# Patient Record
Sex: Female | Born: 2000 | Hispanic: No | Marital: Single | Smoking: Never smoker
Health system: Southern US, Community
[De-identification: ages and names within clinical notes are randomized; demographics above are authoritative.]

## PROBLEM LIST (undated history)

## (undated) DIAGNOSIS — T7840XA Allergy, unspecified, initial encounter: Secondary | ICD-10-CM

## (undated) DIAGNOSIS — F329 Major depressive disorder, single episode, unspecified: Secondary | ICD-10-CM

## (undated) DIAGNOSIS — F32A Depression, unspecified: Secondary | ICD-10-CM

## (undated) DIAGNOSIS — F419 Anxiety disorder, unspecified: Secondary | ICD-10-CM

## (undated) DIAGNOSIS — J45909 Unspecified asthma, uncomplicated: Secondary | ICD-10-CM

## (undated) HISTORY — PX: TONSILLECTOMY: SUR1361

---
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
## 2019-10-31 ENCOUNTER — Emergency Department (HOSPITAL_COMMUNITY)
Admission: EM | Admit: 2019-10-31 | Discharge: 2019-10-31 | Disposition: A | Discharge status: Home / Self Care | Payer: Medicaid Other | Attending: Emergency Medicine | Admitting: Emergency Medicine

## 2019-10-31 ENCOUNTER — Encounter (HOSPITAL_COMMUNITY): Payer: Self-pay | Admitting: Emergency Medicine

## 2019-10-31 ENCOUNTER — Other Ambulatory Visit: Payer: Self-pay

## 2019-10-31 DIAGNOSIS — J45901 Unspecified asthma with (acute) exacerbation: Secondary | ICD-10-CM | POA: Insufficient documentation

## 2019-10-31 HISTORY — DX: Allergy, unspecified, initial encounter: T78.40XA

## 2019-10-31 HISTORY — DX: Unspecified asthma, uncomplicated: J45.909

## 2019-10-31 LAB — I-STAT BETA HCG BLOOD, ED (MC, WL, AP ONLY): I-stat hCG, quantitative: <5 m[IU]/mL (ref <5)

## 2019-10-31 MED ORDER — PREDNISONE 20 MG PO TABS
60.0000 mg | ORAL_TABLET | Freq: Once | ORAL | Status: AC
  Administered 2019-10-31: 60 mg via ORAL
  Filled 2019-10-31: qty 3

## 2019-10-31 MED ORDER — ALBUTEROL SULFATE HFA 108 (90 BASE) MCG/ACT IN AERS
2.0000 | INHALATION_SPRAY | Freq: Once | RESPIRATORY_TRACT | Status: AC
  Administered 2019-10-31: 2 via RESPIRATORY_TRACT
  Filled 2019-10-31: qty 6.7

## 2019-10-31 MED ORDER — IPRATROPIUM BROMIDE HFA 17 MCG/ACT IN AERS
2.0000 | INHALATION_SPRAY | Freq: Once | RESPIRATORY_TRACT | Status: AC
  Administered 2019-10-31: 2 via RESPIRATORY_TRACT
  Filled 2019-10-31: qty 12.9

## 2019-10-31 MED ORDER — ALBUTEROL SULFATE HFA 108 (90 BASE) MCG/ACT IN AERS
8.0000 | INHALATION_SPRAY | Freq: Once | RESPIRATORY_TRACT | Status: AC
  Administered 2019-10-31: 8 via RESPIRATORY_TRACT
  Filled 2019-10-31: qty 6.7

## 2019-10-31 MED ORDER — PREDNISONE 20 MG PO TABS: 60.0000 mg | ORAL_TABLET | Freq: Every day | ORAL | 0 refills | Status: AC

## 2019-10-31 NOTE — ED Triage Notes (Signed)
Pt reports asthma exacerbation since walking to work last night.  Audible wheezing on arrival to triage.  States she is out of inhalers.

## 2019-10-31 NOTE — ED Provider Notes (Signed)
[facility] EMERGENCY DEPARTMENT Provider Note   CSN: [id number] Arrival date & time: 10/31/19  9417     History Chief Complaint  Patient presents with  . Asthma    Karen Ward is a 19 y.o. female.  The history is provided by the patient.  Shortness of Breath Severity:  Moderate Onset quality:  Gradual Timing:  Constant Progression:  Worsening Chronicity:  Recurrent Context: weather changes (asthma symtpoms while walking in the cold to work this morning)   Relieved by:  Nothing Worsened by:  Nothing Associated symptoms: cough and wheezing   Associated symptoms: no abdominal pain, no chest pain, no ear pain, no fever, no rash, no sore throat and no vomiting        Past Medical History:  Diagnosis Date  . Allergies   . Anxiety   . Asthma   . Depression     There are no problems to display for this patient.   Past Surgical History:  Procedure Laterality Date  . TONSILLECTOMY       OB History   No obstetric history on file.     Family History  Family history unknown: Yes    Social History   Tobacco Use  . Smoking status: Never Smoker  . Smokeless tobacco: Never Used  Substance Use Topics  . Alcohol use: Yes    Comment: occ  . Drug use: Yes    Types: Marijuana    Comment: occ    Home Medications Prior to Admission medications   Medication Sig Start Date End Date Taking? Authorizing Provider  cyclobenzaprine (FLEXERIL) 5 MG tablet Take 1 tablet (5 mg total) by mouth 2 (two) times daily as needed for muscle spasms. 12/22/17   Janne Napoleon, NP  naproxen (NAPROSYN) 375 MG tablet Take 1 tablet (375 mg total) by mouth 2 (two) times daily. 12/22/17   Janne Napoleon, NP  predniSONE (DELTASONE) 20 MG tablet Take 3 tablets (60 mg total) by mouth daily for 4 days. 10/31/19 11/04/19  Klee Kolek, DO    Allergies    Shellfish allergy, Fish allergy, and Other  Review of Systems   Review of Systems  Constitutional: Negative for  chills and fever.  HENT: Negative for ear pain and sore throat.   Eyes: Negative for pain and visual disturbance.  Respiratory: Positive for cough, shortness of breath and wheezing.   Cardiovascular: Negative for chest pain and palpitations.  Gastrointestinal: Negative for abdominal pain and vomiting.  Genitourinary: Negative for dysuria and hematuria.  Musculoskeletal: Negative for arthralgias and back pain.  Skin: Negative for color change and rash.  Neurological: Negative for seizures and syncope.  All other systems reviewed and are negative.   Physical Exam Updated Vital Signs BP 129/72   Pulse (!) 125   Temp 98.7 F (37.1 C) (Oral)   Resp 20   LMP 10/10/2019   SpO2 100%   Physical Exam Vitals and nursing note reviewed.  Constitutional:      General: She is not in acute distress.    Appearance: She is well-developed.  HENT:     Head: Normocephalic and atraumatic.     Nose: Nose normal.     Mouth/Throat:     Mouth: Mucous membranes are moist.     Pharynx: No oropharyngeal exudate or posterior oropharyngeal erythema.  Eyes:     Extraocular Movements: Extraocular movements intact.     Conjunctiva/sclera: Conjunctivae normal.     Pupils: Pupils are equal, round, and reactive  to light.  Cardiovascular:     Rate and Rhythm: Regular rhythm. Tachycardia present.     Pulses: Normal pulses.     Heart sounds: No murmur heard.   Pulmonary:     Effort: Pulmonary effort is normal. No respiratory distress.     Breath sounds: Wheezing present.  Abdominal:     Palpations: Abdomen is soft.     Tenderness: There is no abdominal tenderness.  Musculoskeletal:     Cervical back: Neck supple.  Skin:    General: Skin is warm and dry.     Capillary Refill: Capillary refill takes less than 2 seconds.  Neurological:     General: No focal deficit present.     Mental Status: She is alert.     ED Results / Procedures / Treatments   Labs (all labs ordered are listed, but only  abnormal results are displayed) Labs Reviewed  I-STAT BETA HCG BLOOD, ED ([facility], [facility], [facility] ONLY)    EKG EKG Interpretation  Date/Time:  Sunday October 31 2019 08:55:04 EDT Ventricular Rate:  111 PR Interval:  156 QRS Duration: 72 QT Interval:  312 QTC Calculation: 424 R Axis:   78 Text Interpretation: Sinus tachycardia Otherwise normal ECG Confirmed by Kaylan Friedmann, Siriah Treat ([id number]) on 10/31/2019 10:28:34 AM   Radiology No results found.  Procedures Procedures (including critical care time)  Medications Ordered in ED Medications  albuterol (VENTOLIN HFA) 108 (90 Base) MCG/ACT inhaler 8 puff (8 puffs Inhalation Given 10/31/19 0908)  ipratropium (ATROVENT HFA) inhaler 2 puff (2 puffs Inhalation Given 10/31/19 0908)  predniSONE (DELTASONE) tablet 60 mg (60 mg Oral Given 10/31/19 1005)    ED Course  I have reviewed the triage vital signs and the nursing notes.  Pertinent labs & imaging results that were available during my care of the patient were reviewed by me and considered in my medical decision making (see chart for details).    MDM Rules/Calculators/A&P                          Karen Ward is a 19 year old female with history of asthma who presents the ED with shortness of breath, wheezing.  Patient with overall unremarkable vitals.  Wheezing throughout on exam.  Appears that she had bronchospasm.  She states that she has some symptoms overnight but was worse while walking in the cold to work today.  Did not have her inhaler use.  Patient given albuterol and Atrovent with great improvement.  Given first dose of steroids.  On repeat exam patient with improved breath sounds minimal wheezing.  Overall suspect asthma exacerbation.  Recommend continued use of albuterol and prednisone.  No hypoxia.  No respiratory distress.  Discharged in good condition.  Understands return precautions.  This chart was dictated using voice recognition software.  Despite best efforts to proofread,  errors  can occur which can change the documentation meaning.    Final Clinical Impression(s) / ED Diagnoses Final diagnoses:  Mild asthma with exacerbation, unspecified whether persistent    Rx / DC Orders ED Discharge Orders         Ordered    predniSONE (DELTASONE) 20 MG tablet  Daily        10 /17/21 1102           Keifer Habib, DO 10/31/19 1102

## 2019-10-31 NOTE — ED Notes (Signed)
Pt stated that she was always nervous and anxious in a hospital setting, but stated otherwise she felt fine

## 2020-03-19 ENCOUNTER — Other Ambulatory Visit: Payer: Self-pay

## 2020-03-19 ENCOUNTER — Emergency Department (HOSPITAL_COMMUNITY)
Admission: EM | Admit: 2020-03-19 | Discharge: 2020-03-19 | Disposition: A | Discharge status: Home / Self Care | Payer: Self-pay | Attending: Emergency Medicine | Admitting: Emergency Medicine

## 2020-03-19 ENCOUNTER — Emergency Department (HOSPITAL_COMMUNITY): Payer: Self-pay

## 2020-03-19 ENCOUNTER — Encounter (HOSPITAL_COMMUNITY): Payer: Self-pay

## 2020-03-19 DIAGNOSIS — R10817 Generalized abdominal tenderness: Secondary | ICD-10-CM | POA: Insufficient documentation

## 2020-03-19 DIAGNOSIS — R110 Nausea: Secondary | ICD-10-CM | POA: Diagnosis not present

## 2020-03-19 DIAGNOSIS — R42 Dizziness and giddiness: Secondary | ICD-10-CM | POA: Diagnosis not present

## 2020-03-19 DIAGNOSIS — Y92410 Unspecified street and highway as the place of occurrence of the external cause: Secondary | ICD-10-CM | POA: Insufficient documentation

## 2020-03-19 DIAGNOSIS — N939 Abnormal uterine and vaginal bleeding, unspecified: Secondary | ICD-10-CM | POA: Insufficient documentation

## 2020-03-19 DIAGNOSIS — R519 Headache, unspecified: Secondary | ICD-10-CM | POA: Insufficient documentation

## 2020-03-19 DIAGNOSIS — H53149 Visual discomfort, unspecified: Secondary | ICD-10-CM | POA: Diagnosis not present

## 2020-03-19 DIAGNOSIS — J45909 Unspecified asthma, uncomplicated: Secondary | ICD-10-CM | POA: Insufficient documentation

## 2020-03-19 LAB — COMPREHENSIVE METABOLIC PANEL
ALT: 15 U/L (ref 0–44)
AST: 18 U/L (ref 15–41)
Albumin: 4.3 g/dL (ref 3.5–5.0)
Alkaline Phosphatase: 34 U/L — ABNORMAL LOW (ref 38–126)
Anion gap: 10 (ref 5–15)
BUN: 12 mg/dL (ref 6–20)
CO2: 22 mmol/L (ref 22–32)
Calcium: 8.9 mg/dL (ref 8.9–10.3)
Chloride: 107 mmol/L (ref 98–111)
Creatinine, Ser: 0.83 mg/dL (ref 0.44–1.00)
GFR, Estimated: >60 mL/min (ref >60)
Glucose, Bld: 95 mg/dL (ref 70–99)
Potassium: 3.9 mmol/L (ref 3.5–5.1)
Sodium: 139 mmol/L (ref 135–145)
Total Bilirubin: 0.8 mg/dL (ref 0.3–1.2)
Total Protein: 7.6 g/dL (ref 6.5–8.1)

## 2020-03-19 LAB — URINALYSIS, ROUTINE W REFLEX MICROSCOPIC
Bacteria, UA: NONE SEEN
Bilirubin Urine: NEGATIVE
Glucose, UA: NEGATIVE mg/dL
Ketones, ur: 5 mg/dL — AB
Leukocytes,Ua: NEGATIVE
Nitrite: NEGATIVE
Protein, ur: 30 mg/dL — AB
RBC / HPF: >50 RBC/hpf — ABNORMAL HIGH (ref 0–5)
Specific Gravity, Urine: 1.027 (ref 1.005–1.030)
pH: 8 (ref 5.0–8.0)

## 2020-03-19 LAB — CBC WITH DIFFERENTIAL/PLATELET
Abs Immature Granulocytes: 0.02 10*3/uL (ref 0.00–0.07)
Basophils Absolute: 0.1 10*3/uL (ref 0.0–0.1)
Basophils Relative: 1 %
Eosinophils Absolute: 0.4 10*3/uL (ref 0.0–0.5)
Eosinophils Relative: 7 %
HCT: 41.5 % (ref 36.0–46.0)
Hemoglobin: 13.7 g/dL (ref 12.0–15.0)
Immature Granulocytes: 0 %
Lymphocytes Relative: 20 %
Lymphs Abs: 1.3 10*3/uL (ref 0.7–4.0)
MCH: 30.7 pg (ref 26.0–34.0)
MCHC: 33 g/dL (ref 30.0–36.0)
MCV: 93 fL (ref 80.0–100.0)
Monocytes Absolute: 0.3 10*3/uL (ref 0.1–1.0)
Monocytes Relative: 4 %
Neutro Abs: 4.4 10*3/uL (ref 1.7–7.7)
Neutrophils Relative %: 68 %
Platelets: 307 10*3/uL (ref 150–400)
RBC: 4.46 MIL/uL (ref 3.87–5.11)
RDW: 12.8 % (ref 11.5–15.5)
WBC: 6.5 10*3/uL (ref 4.0–10.5)
nRBC: 0 % (ref 0.0–0.2)

## 2020-03-19 LAB — I-STAT BETA HCG BLOOD, ED (MC, WL, AP ONLY): I-stat hCG, quantitative: <5 m[IU]/mL (ref <5)

## 2020-03-19 MED ORDER — IOHEXOL 300 MG/ML  SOLN
100.0000 mL | Freq: Once | INTRAMUSCULAR | Status: AC | PRN
  Administered 2020-03-19: 100 mL via INTRAVENOUS

## 2020-03-19 MED ORDER — KETOROLAC TROMETHAMINE 15 MG/ML IJ SOLN
15.0000 mg | Freq: Once | INTRAMUSCULAR | Status: AC
  Administered 2020-03-19: 15 mg via INTRAVENOUS
  Filled 2020-03-19: qty 1

## 2020-03-19 MED ORDER — ACETAMINOPHEN 325 MG PO TABS
650.0000 mg | ORAL_TABLET | Freq: Once | ORAL | Status: AC
  Administered 2020-03-19: 650 mg via ORAL
  Filled 2020-03-19: qty 2

## 2020-03-19 MED ORDER — LIDOCAINE 5 % EX PTCH
1.0000 | MEDICATED_PATCH | Freq: Once | CUTANEOUS | Status: DC
  Administered 2020-03-19: 1 via TRANSDERMAL
  Filled 2020-03-19: qty 1

## 2020-03-19 MED ORDER — SODIUM CHLORIDE 0.9 % IV BOLUS
1000.0000 mL | Freq: Once | INTRAVENOUS | Status: AC
  Administered 2020-03-19: 1000 mL via INTRAVENOUS

## 2020-03-19 MED ORDER — ONDANSETRON HCL 4 MG/2ML IJ SOLN
4.0000 mg | Freq: Once | INTRAMUSCULAR | Status: AC
  Administered 2020-03-19: 4 mg via INTRAVENOUS
  Filled 2020-03-19: qty 2

## 2020-03-19 NOTE — ED Notes (Signed)
Provided pt with incentive spirometer and instructions on use. Pt was able to verbalize usage.

## 2020-03-19 NOTE — Discharge Instructions (Addendum)
-  Your work-up today did not show any traumatic findings.  -as we discussed your xray did not show rib fracture.  Could be a small fracture that was not seen however the treatment would be the same so no CT scan was performed.  There were no signs of trauma seen on chest x-ray.  -You should take Tylenol and ibuprofen at home for pain.  Especially over the next few days.  -You can try over-the-counter lidocaine patches to help with pain as well.  -You were given an incentive spirometer to help encourage you to take deep breaths.  We want you to do this while you are awake during the day at least every couple of hours so that you do not develop infection in your lungs.  -Follow-up with the GYN clinic as needed. -Follow-up with community health and wellness clinic as needed -Follow-up with concussion clinic as needed.   Return to emergency department for new or worsening symptoms.  I hope you feel better.

## 2020-03-19 NOTE — ED Notes (Signed)
Pt offered ice water. Tolerated with no nausea.
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
## 2020-03-22 ENCOUNTER — Encounter (HOSPITAL_COMMUNITY): Payer: Self-pay | Admitting: *Deleted

## 2020-03-22 ENCOUNTER — Emergency Department (HOSPITAL_COMMUNITY)
Admission: EM | Admit: 2020-03-22 | Discharge: 2020-03-22 | Disposition: A | Discharge status: Home / Self Care | Payer: No Typology Code available for payment source | Attending: Emergency Medicine | Admitting: Emergency Medicine

## 2020-03-22 ENCOUNTER — Other Ambulatory Visit: Payer: Self-pay

## 2020-03-22 DIAGNOSIS — M25511 Pain in right shoulder: Secondary | ICD-10-CM | POA: Diagnosis not present

## 2020-03-22 DIAGNOSIS — M549 Dorsalgia, unspecified: Secondary | ICD-10-CM

## 2020-03-22 DIAGNOSIS — Y92410 Unspecified street and highway as the place of occurrence of the external cause: Secondary | ICD-10-CM | POA: Insufficient documentation

## 2020-03-22 DIAGNOSIS — J45909 Unspecified asthma, uncomplicated: Secondary | ICD-10-CM | POA: Insufficient documentation

## 2020-03-22 DIAGNOSIS — S0990XD Unspecified injury of head, subsequent encounter: Secondary | ICD-10-CM | POA: Diagnosis not present

## 2020-03-22 DIAGNOSIS — M546 Pain in thoracic spine: Secondary | ICD-10-CM | POA: Insufficient documentation

## 2020-03-22 DIAGNOSIS — S0990XA Unspecified injury of head, initial encounter: Secondary | ICD-10-CM | POA: Diagnosis present

## 2020-03-22 MED ORDER — ONDANSETRON 4 MG PO TBDP: 4.0000 mg | ORAL_TABLET | Freq: Three times a day (TID) | ORAL | 0 refills | Status: DC | PRN

## 2020-03-22 MED ORDER — METHOCARBAMOL 500 MG PO TABS: 500.0000 mg | ORAL_TABLET | Freq: Two times a day (BID) | ORAL | 0 refills | Status: AC

## 2020-03-22 NOTE — ED Notes (Signed)
Sandwich and juice provided.

## 2020-03-22 NOTE — Discharge Instructions (Addendum)
Take it easy, but do not lay around too much as this may make any stiffness worse.  Antiinflammatory medications: Take 600 mg of ibuprofen every 6 hours or 440 mg (over the counter dose) to 500 mg (prescription dose) of naproxen every 12 hours for the next 3 days. After this time, these medications may be used as needed for pain. Take these medications with food to avoid upset stomach. Choose only one of these medications, do not take them together. Acetaminophen (generic for Tylenol): Should you continue to have additional pain while taking the ibuprofen or naproxen, you may add in acetaminophen as needed. Your daily total maximum amount of acetaminophen from all sources should be limited to 4000mg /day for persons without liver problems, or 2000mg /day for those with liver problems. Methocarbamol: Methocarbamol (generic for Robaxin) is a muscle relaxer and can help relieve stiff muscles or muscle spasms.  Do not drive or perform other dangerous activities while taking this medication as it can cause drowsiness as well as changes in reaction time and judgement. Lidocaine patches: These are available via either prescription or over-the-counter. The over-the-counter option may be more economical one and are likely just as effective. There are multiple over-the-counter brands, such as Salonpas. Nausea/vomiting: Use the ondansetron (generic for Zofran) for nausea or vomiting.  This medication may not prevent all vomiting or nausea, but can help facilitate better hydration. Things that can help with nausea/vomiting also include peppermint/menthol candies, vitamin B12, and ginger. Ice: May apply ice to the area over the next 24 hours for 15 minutes at a time to reduce pain, inflammation, and swelling, if present. Exercises: Be sure to perform the attached exercises starting with three times a week and working up to performing them daily. This is an essential part of preventing long term problems.  Follow up:  Follow up with a primary care provider for any future management of these complaints. Be sure to follow up within 7-10 days. Return: Return to the ED should symptoms worsen.  For prescription assistance, may try using prescription discount sites or apps, such as goodrx.com   Head Injury You have been seen today for a head injury, which may or may not have resulted in a concussion. It does not appear to be serious at this time, however, it is important to note that your presentation today is not necessarily an indication of the severity of future symptoms.  Expected symptoms: Expected symptoms of concussion and/or head injury can include nausea, headache, mild dizziness (should still be able to get up and walk around without difficulty), difficulty concentrating, increased sleep, difficulty sleeping, increased intensity of emotions. Close observation: The close observation period is usually 6 hours from the injury. This includes staying awake and having a trustworthy adult monitor you to assure your condition does not worsen. You should be in regular contact with this person and ideally, they should be able to monitor you in person.  Secondary observation: The secondary observation period is usually 24 hours from the injury. You are allowed to sleep during this time. A trustworthy adult should intermittently monitor you to assure your condition does not worsen.   Overall head injury/concussion care: Rest: Be sure to get plenty of rest. You will need more rest and sleep while you recover. Hydration: Be sure to stay well hydrated by having a goal of drinking about 0.5 liters of water an hour. Pain:  Antiinflammatory medications: Take 600 mg of ibuprofen every 6 hours or 440 mg (over the counter dose) to  500 mg (prescription dose) of naproxen every 12 hours or for the next 3 days. After this time, these medications may be used as needed for pain. Take these medications with food to avoid upset stomach.  Choose only one of these medications, do not take them together. Tylenol: Should you continue to have additional pain while taking the ibuprofen or naproxen, you may add in tylenol as needed. Your daily total maximum amount of tylenol from all sources should be limited to 4000mg /day for persons without liver problems, or 2000mg /day for those with liver problems. Return to sports and activities: In general, you may return to normal activities once symptoms have subsided, however, you would ideally be cleared by a primary care provider or other qualified medical professional prior to return to these activities.  Follow up: Follow up with the concussion clinic or your primary care provider for further management of this issue. Return: Return to the ED should you begin to have confusion, abnormal behavior, aggression, violence, or personality changes, repeated vomiting, vision loss, numbness or weakness on one side of the body, difficulty standing due to dizziness, significantly worsening pain, or any other major concerns.
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
## 2020-04-05 ENCOUNTER — Emergency Department (HOSPITAL_COMMUNITY): Payer: Self-pay

## 2020-04-05 ENCOUNTER — Emergency Department (HOSPITAL_COMMUNITY)
Admission: EM | Admit: 2020-04-05 | Discharge: 2020-04-05 | Disposition: A | Discharge status: Home / Self Care | Payer: Self-pay | Attending: Emergency Medicine | Admitting: Emergency Medicine

## 2020-04-05 ENCOUNTER — Other Ambulatory Visit: Payer: Self-pay

## 2020-04-05 ENCOUNTER — Encounter (HOSPITAL_COMMUNITY): Payer: Self-pay

## 2020-04-05 DIAGNOSIS — H53149 Visual discomfort, unspecified: Secondary | ICD-10-CM | POA: Insufficient documentation

## 2020-04-05 DIAGNOSIS — R110 Nausea: Secondary | ICD-10-CM | POA: Diagnosis not present

## 2020-04-05 DIAGNOSIS — R519 Headache, unspecified: Secondary | ICD-10-CM | POA: Diagnosis not present

## 2020-04-05 DIAGNOSIS — R000 Tachycardia, unspecified: Secondary | ICD-10-CM | POA: Insufficient documentation

## 2020-04-05 DIAGNOSIS — H538 Other visual disturbances: Secondary | ICD-10-CM | POA: Insufficient documentation

## 2020-04-05 DIAGNOSIS — F0781 Postconcussional syndrome: Secondary | ICD-10-CM | POA: Diagnosis not present

## 2020-04-05 DIAGNOSIS — M549 Dorsalgia, unspecified: Secondary | ICD-10-CM | POA: Insufficient documentation

## 2020-04-05 DIAGNOSIS — J45909 Unspecified asthma, uncomplicated: Secondary | ICD-10-CM | POA: Diagnosis not present

## 2020-04-05 MED ORDER — PROCHLORPERAZINE EDISYLATE 10 MG/2ML IJ SOLN
10.0000 mg | Freq: Once | INTRAMUSCULAR | Status: AC
  Administered 2020-04-05: 10 mg via INTRAVENOUS
  Filled 2020-04-05: qty 2

## 2020-04-05 MED ORDER — ONDANSETRON 4 MG PO TBDP: 4.0000 mg | ORAL_TABLET | Freq: Three times a day (TID) | ORAL | 0 refills | Status: AC | PRN

## 2020-04-05 MED ORDER — SODIUM CHLORIDE 0.9 % IV BOLUS
1000.0000 mL | Freq: Once | INTRAVENOUS | Status: AC
  Administered 2020-04-05: 1000 mL via INTRAVENOUS

## 2020-04-05 MED ORDER — DIPHENHYDRAMINE HCL 50 MG/ML IJ SOLN
25.0000 mg | Freq: Once | INTRAMUSCULAR | Status: AC
  Administered 2020-04-05: 25 mg via INTRAVENOUS
  Filled 2020-04-05: qty 1

## 2020-04-05 MED ORDER — KETOROLAC TROMETHAMINE 30 MG/ML IJ SOLN
30.0000 mg | Freq: Once | INTRAMUSCULAR | Status: AC
  Administered 2020-04-05: 30 mg via INTRAVENOUS
  Filled 2020-04-05: qty 1

## 2020-04-05 NOTE — ED Notes (Signed)
Pt called ride to come pick her up, RN went over dc papers and follow-up care w neurology. Pt A&O X4, ambulating independently, steady gait, gave pt work note, pt does not have any questions, ready for discharge
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
## 2020-04-05 NOTE — ED Notes (Addendum)
error 

## 2020-04-05 NOTE — Discharge Instructions (Signed)
Please refer to the attached instructions.  Follow-up with neurology and/or the concussion clinic as discussed.

## 2021-01-17 ENCOUNTER — Emergency Department (HOSPITAL_COMMUNITY): Payer: Medicaid Other

## 2021-01-17 ENCOUNTER — Emergency Department (HOSPITAL_COMMUNITY)
Admission: EM | Admit: 2021-01-17 | Discharge: 2021-01-17 | Disposition: A | Discharge status: Home / Self Care | Payer: Medicaid Other | Attending: Emergency Medicine | Admitting: Emergency Medicine

## 2021-01-17 ENCOUNTER — Encounter (HOSPITAL_COMMUNITY): Payer: Self-pay | Admitting: Emergency Medicine

## 2021-01-17 DIAGNOSIS — J45909 Unspecified asthma, uncomplicated: Secondary | ICD-10-CM | POA: Insufficient documentation

## 2021-01-17 DIAGNOSIS — Z5321 Procedure and treatment not carried out due to patient leaving prior to being seen by health care provider: Secondary | ICD-10-CM | POA: Insufficient documentation

## 2021-01-17 DIAGNOSIS — Z20822 Contact with and (suspected) exposure to covid-19: Secondary | ICD-10-CM | POA: Insufficient documentation

## 2021-01-17 LAB — RESP PANEL BY RT-PCR (FLU A&B, COVID) ARPGX2
Influenza A by PCR: NEGATIVE
Influenza B by PCR: NEGATIVE
SARS Coronavirus 2 by RT PCR: NEGATIVE

## 2021-01-17 LAB — BASIC METABOLIC PANEL
Anion gap: 8 (ref 5–15)
BUN: 11 mg/dL (ref 6–20)
CO2: 21 mmol/L — ABNORMAL LOW (ref 22–32)
Calcium: 9.1 mg/dL (ref 8.9–10.3)
Chloride: 108 mmol/L (ref 98–111)
Creatinine, Ser: 0.83 mg/dL (ref 0.44–1.00)
GFR, Estimated: >60 mL/min (ref >60)
Glucose, Bld: 77 mg/dL (ref 70–99)
Potassium: 4 mmol/L (ref 3.5–5.1)
Sodium: 137 mmol/L (ref 135–145)

## 2021-01-17 LAB — CBC WITH DIFFERENTIAL/PLATELET
Abs Immature Granulocytes: 0.03 10*3/uL (ref 0.00–0.07)
Basophils Absolute: 0.1 10*3/uL (ref 0.0–0.1)
Basophils Relative: 1 %
Eosinophils Absolute: 1.8 10*3/uL — ABNORMAL HIGH (ref 0.0–0.5)
Eosinophils Relative: 15 %
HCT: 44.1 % (ref 36.0–46.0)
Hemoglobin: 14.5 g/dL (ref 12.0–15.0)
Immature Granulocytes: 0 %
Lymphocytes Relative: 21 %
Lymphs Abs: 2.5 10*3/uL (ref 0.7–4.0)
MCH: 30.4 pg (ref 26.0–34.0)
MCHC: 32.9 g/dL (ref 30.0–36.0)
MCV: 92.5 fL (ref 80.0–100.0)
Monocytes Absolute: 0.5 10*3/uL (ref 0.1–1.0)
Monocytes Relative: 5 %
Neutro Abs: 6.9 10*3/uL (ref 1.7–7.7)
Neutrophils Relative %: 58 %
Platelets: 395 10*3/uL (ref 150–400)
RBC: 4.77 MIL/uL (ref 3.87–5.11)
RDW: 13.1 % (ref 11.5–15.5)
WBC: 11.9 10*3/uL — ABNORMAL HIGH (ref 4.0–10.5)
nRBC: 0 % (ref 0.0–0.2)

## 2021-01-17 MED ORDER — ALBUTEROL SULFATE HFA 108 (90 BASE) MCG/ACT IN AERS
2.0000 | INHALATION_SPRAY | RESPIRATORY_TRACT | Status: DC | PRN
  Administered 2021-01-17: 2 via RESPIRATORY_TRACT
  Filled 2021-01-17: qty 6.7

## 2021-01-17 NOTE — ED Triage Notes (Signed)
Per pt, asthma symptoms for a couple of days-out of inhaler and neb

## 2021-01-17 NOTE — ED Provider Triage Note (Signed)
Emergency Medicine Provider Triage Evaluation Note  Karen Ward , a 21 y.o. female  was evaluated in triage.  Pt complains of shortness of breath.  History of asthma, states that she has run out of her albuterol inhaler.  However, she states that she used to only have exercise induced asthma, and is now regularly experiencing asthma symptoms requiring increased use of her inhaler.  Of note, she states that due to financial hardship she has been unable to establish care with a primary care to refill her albuterol prescription.  She states that her symptoms have been ongoing for the last 2 days.  She also endorses cough and congestion.  Denies fevers or chills.  Review of Systems  Positive: Shortness of breath, wheezing, congestion Negative: Fevers, chills  Physical Exam  BP 131/85 (BP Location: Right Arm)    Pulse (!) 115    Temp 98.6 F (37 C) (Oral)    Resp 18    SpO2 96%  Gen:   Awake, no distress  Resp:  Normal effort, wheezing present throughout, notably tachycardic without tachypnea MSK:   Moves extremities without difficulty  Other:    Medical Decision Making  Medically screening exam initiated at 10:51 AM.  Appropriate orders placed.  Karen Ward was informed that the remainder of the evaluation will be completed by another provider, this initial triage assessment does not replace that evaluation, and the importance of remaining in the ED until their evaluation is complete.     Silva Bandy, PA-C 01/17/21 1054

## 2021-01-17 NOTE — ED Notes (Signed)
Called x2, no response.  

## 2021-01-17 NOTE — ED Notes (Signed)
Called x1, no response.

## 2022-05-13 ENCOUNTER — Other Ambulatory Visit: Payer: Self-pay

## 2022-05-13 ENCOUNTER — Emergency Department (HOSPITAL_COMMUNITY)
Admission: EM | Admit: 2022-05-13 | Discharge: 2022-05-14 | Discharge status: Against Medical Advice | Payer: Medicaid Other | Attending: Emergency Medicine | Admitting: Emergency Medicine

## 2022-05-13 ENCOUNTER — Encounter (HOSPITAL_COMMUNITY): Payer: Self-pay | Admitting: Emergency Medicine

## 2022-05-13 DIAGNOSIS — Z5329 Procedure and treatment not carried out because of patient's decision for other reasons: Secondary | ICD-10-CM | POA: Insufficient documentation

## 2022-05-13 DIAGNOSIS — T7840XA Allergy, unspecified, initial encounter: Secondary | ICD-10-CM

## 2022-05-13 MED ORDER — FAMOTIDINE IN NACL 20-0.9 MG/50ML-% IV SOLN: 20.0000 mg | Freq: Once | INTRAVENOUS | Status: DC

## 2022-05-13 NOTE — ED Triage Notes (Signed)
Pt in via [facility] with allergic reaction after eating her typical dish at [employer]. 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 EMS arrived to find generalized hives, lip swelling and silent breath sounds. Given 2 doses of Epi (one of IM and one IV), 125mg  Solumedrol, 50mg  Bendaryl and 2 duonebs en route. Pt arrives able to speak full sentences, denies any throat swelling, clear of any body hives. A&ox4

## 2022-05-14 NOTE — ED Notes (Signed)
Pt states she needs to leave and head back home to [place], states her grandfather is here to pick her up. Dr. Nicanor Alcon notified, and pt advised risks of leaving AMA. Pt verbalized understanding, left AMA
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
## 2023-12-26 IMAGING — CR DG CHEST 2V
2 series · 2 of 2 positions shown · non-contrast
Comparison: 03/19/2020

CLINICAL DATA: Shortness of breath, asthma symptoms for couple
days, out of inhaler and nebulizer

EXAM:
CHEST - 2 VIEW

[w chest pa]
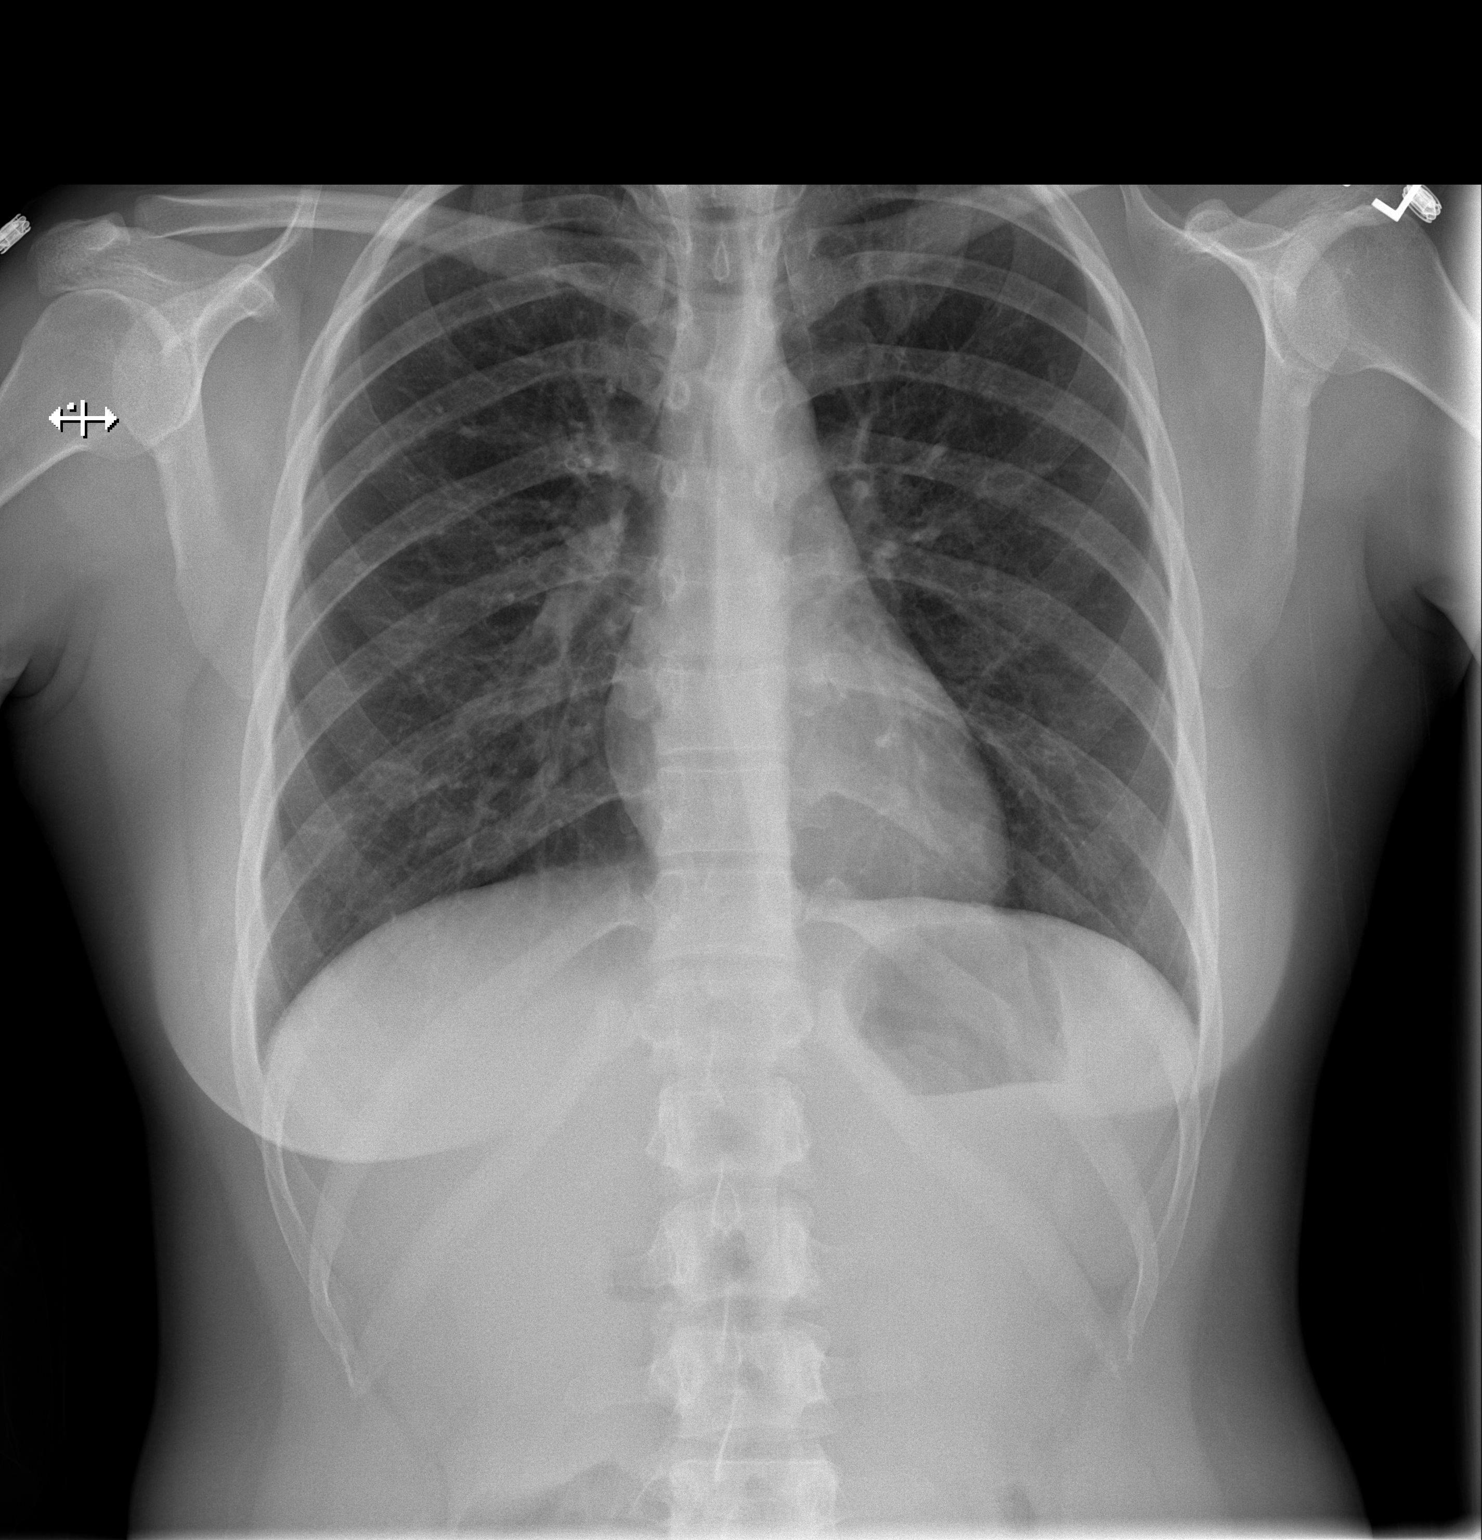

[w chest lat]
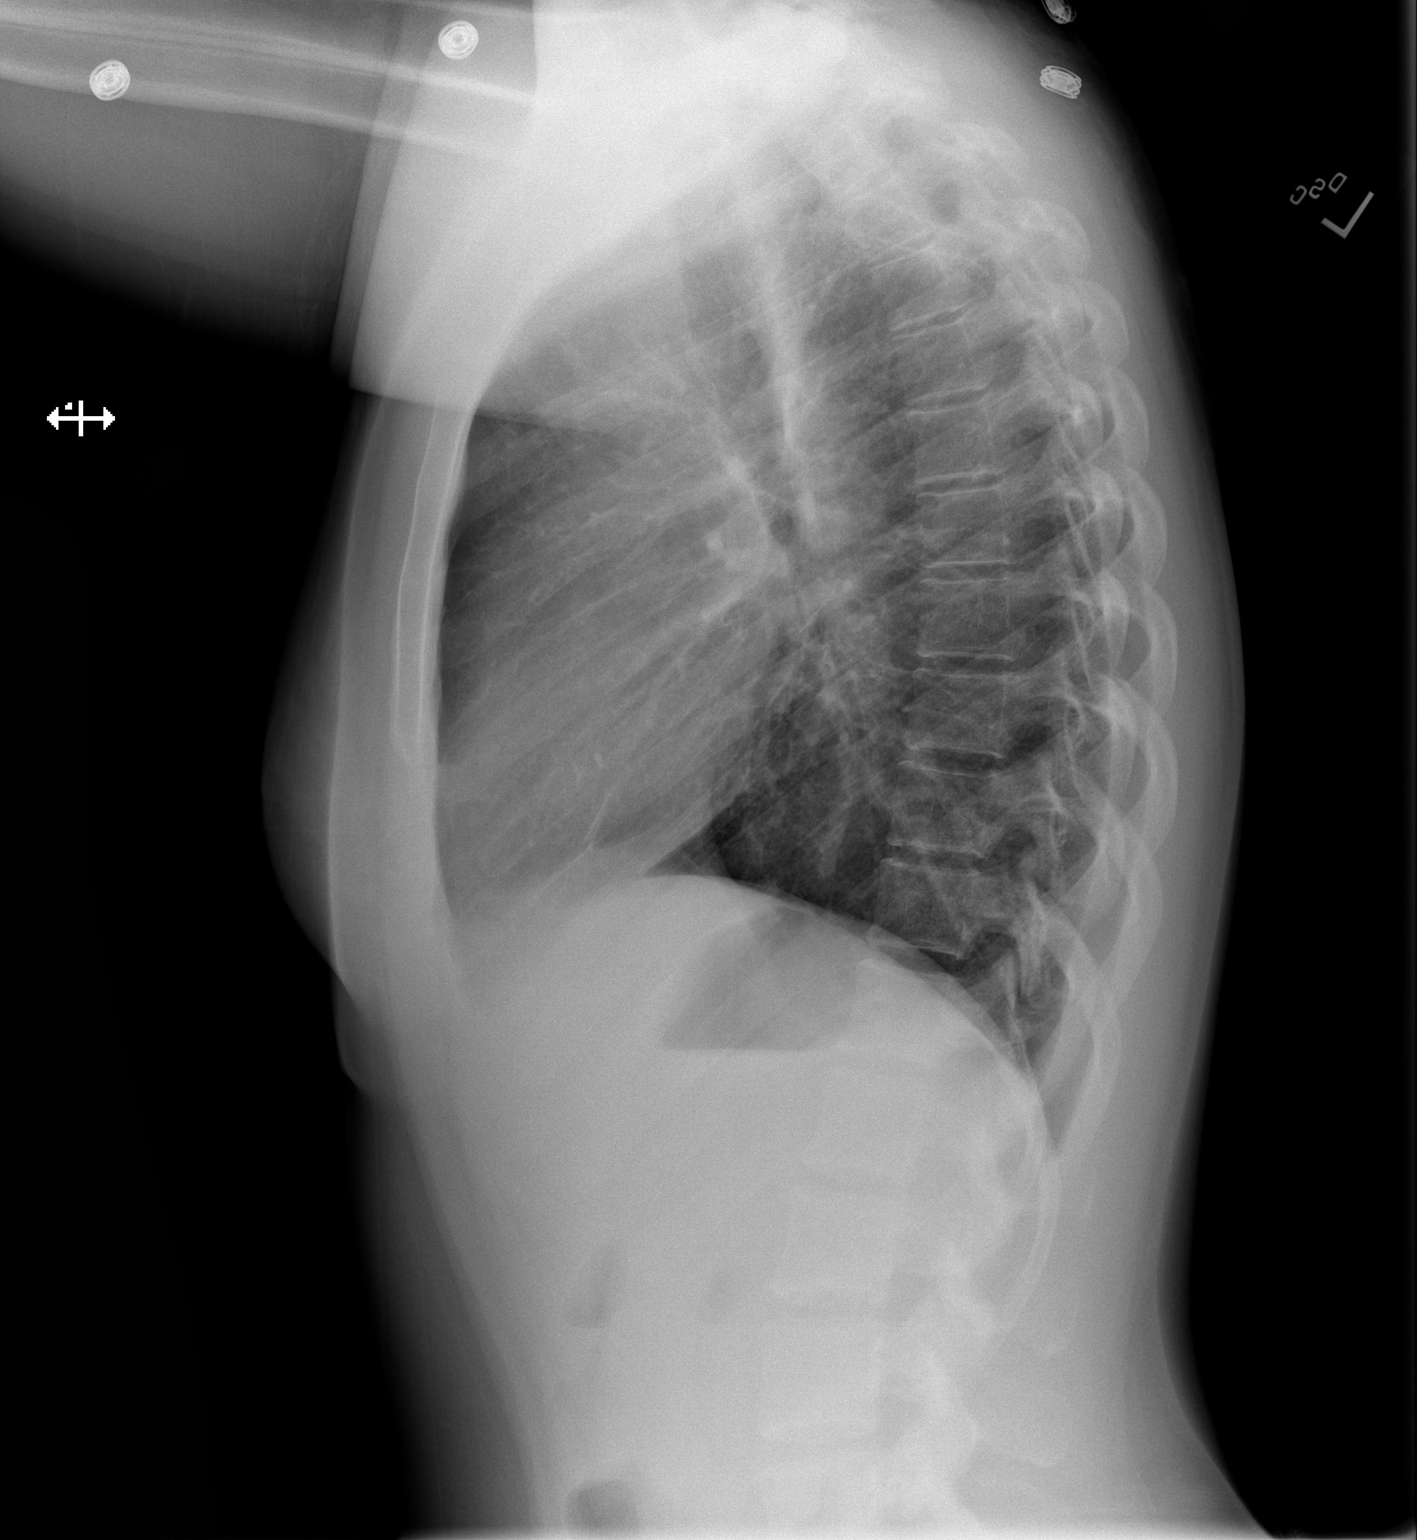

[2 of 2 positions shown; findings below may reference images not displayed]

FINDINGS: Normal heart size, mediastinal contours, and pulmonary vascularity.

Minimal chronic peribronchial thickening.

No pulmonary infiltrate, pleural effusion, or pneumothorax.

Osseous structures unremarkable.
IMPRESSION: Minimal chronic peribronchial thickening which can be seen with
chronic bronchitis and asthma.

No acute infiltrate.
